# Patient Record
Sex: Female | Born: 1940 | Race: White | Hispanic: No | Marital: Married | Smoking: Never smoker
Health system: Southern US, Community
[De-identification: ages and names within clinical notes are randomized; demographics above are authoritative.]

## PROBLEM LIST (undated history)

## (undated) DIAGNOSIS — C801 Malignant (primary) neoplasm, unspecified: Secondary | ICD-10-CM

## (undated) DIAGNOSIS — I10 Essential (primary) hypertension: Secondary | ICD-10-CM

## (undated) HISTORY — PX: ABDOMINAL HYSTERECTOMY: SHX81

## (undated) HISTORY — PX: ABDOMINAL SURGERY: SHX537

## (undated) HISTORY — PX: MASTECTOMY: SHX3

---

## 2003-09-25 ENCOUNTER — Encounter: Admission: RE | Admit: 2003-09-25 | Discharge: 2003-09-25 | Payer: Self-pay | Admitting: Oncology

## 2003-10-06 ENCOUNTER — Ambulatory Visit (HOSPITAL_COMMUNITY): Admission: RE | Admit: 2003-10-06 | Discharge: 2003-10-06 | Payer: Self-pay | Admitting: Oncology

## 2004-04-13 ENCOUNTER — Ambulatory Visit (HOSPITAL_COMMUNITY): Admission: RE | Admit: 2004-04-13 | Discharge: 2004-04-13 | Payer: Self-pay | Admitting: Oncology

## 2004-09-23 ENCOUNTER — Encounter (INDEPENDENT_AMBULATORY_CARE_PROVIDER_SITE_OTHER): Payer: Self-pay | Admitting: Cardiology

## 2004-09-23 ENCOUNTER — Ambulatory Visit: Admission: RE | Admit: 2004-09-23 | Discharge: 2004-09-23 | Payer: Self-pay | Admitting: Oncology

## 2004-10-08 ENCOUNTER — Ambulatory Visit: Payer: Self-pay | Admitting: Oncology

## 2005-04-07 ENCOUNTER — Ambulatory Visit: Payer: Self-pay | Admitting: Oncology

## 2005-04-08 ENCOUNTER — Ambulatory Visit (HOSPITAL_COMMUNITY): Admission: RE | Admit: 2005-04-08 | Discharge: 2005-04-08 | Payer: Self-pay | Admitting: Oncology

## 2005-05-06 ENCOUNTER — Ambulatory Visit (HOSPITAL_COMMUNITY): Admission: RE | Admit: 2005-05-06 | Discharge: 2005-05-06 | Payer: Self-pay | Admitting: Oncology

## 2005-06-01 ENCOUNTER — Ambulatory Visit (HOSPITAL_COMMUNITY): Admission: RE | Admit: 2005-06-01 | Discharge: 2005-06-01 | Payer: Self-pay | Admitting: Oncology

## 2005-10-06 ENCOUNTER — Ambulatory Visit: Payer: Self-pay | Admitting: Oncology

## 2006-03-27 ENCOUNTER — Ambulatory Visit: Payer: Self-pay | Admitting: Oncology

## 2006-03-31 LAB — CBC WITH DIFFERENTIAL/PLATELET
EOS%: 4.1 % (ref 0.0–7.0)
Eosinophils Absolute: 0.2 10*3/uL (ref 0.0–0.5)
LYMPH%: 32.2 % (ref 14.0–48.0)
MONO#: 0.4 10*3/uL (ref 0.1–0.9)
MONO%: 7.6 % (ref 0.0–13.0)
NEUT#: 2.8 10*3/uL (ref 1.5–6.5)

## 2006-03-31 LAB — RESEARCH LABS

## 2006-03-31 LAB — COMPREHENSIVE METABOLIC PANEL
BUN: 18 mg/dL (ref 6–23)
Creatinine, Ser: 0.77 mg/dL (ref 0.40–1.20)
Glucose, Bld: 94 mg/dL (ref 70–99)
Sodium: 137 mEq/L (ref 135–145)
Total Bilirubin: 0.4 mg/dL (ref 0.3–1.2)

## 2006-03-31 LAB — LACTATE DEHYDROGENASE: LDH: 189 U/L (ref 94–250)

## 2006-03-31 LAB — CANCER ANTIGEN 27.29: CA 27.29: 14 U/mL (ref 0–39)

## 2006-04-03 ENCOUNTER — Ambulatory Visit (HOSPITAL_COMMUNITY): Admission: RE | Admit: 2006-04-03 | Discharge: 2006-04-03 | Payer: Self-pay | Admitting: Oncology

## 2006-10-09 ENCOUNTER — Ambulatory Visit: Payer: Self-pay | Admitting: Oncology

## 2006-10-12 LAB — CBC WITH DIFFERENTIAL/PLATELET
BASO%: 0.5 % (ref 0.0–2.0)
EOS%: 4.9 % (ref 0.0–7.0)
Eosinophils Absolute: 0.3 10*3/uL (ref 0.0–0.5)
HCT: 41.6 % (ref 34.8–46.6)
HGB: 14.1 g/dL (ref 11.6–15.9)
LYMPH%: 25.9 % (ref 14.0–48.0)
MCH: 30 pg (ref 26.0–34.0)
MCHC: 34 g/dL (ref 32.0–36.0)
MONO%: 5.8 % (ref 0.0–13.0)
RBC: 4.71 10*6/uL (ref 3.70–5.32)
WBC: 5.5 10*3/uL (ref 3.9–10.0)

## 2006-10-12 LAB — COMPREHENSIVE METABOLIC PANEL
Albumin: 4.5 g/dL (ref 3.5–5.2)
Creatinine, Ser: 0.79 mg/dL (ref 0.40–1.20)
Glucose, Bld: 102 mg/dL — ABNORMAL HIGH (ref 70–99)
Total Protein: 7.2 g/dL (ref 6.0–8.3)

## 2006-10-12 LAB — RESEARCH LABS

## 2006-10-12 LAB — CANCER ANTIGEN 27.29: CA 27.29: 17 U/mL (ref 0–39)

## 2007-11-30 ENCOUNTER — Ambulatory Visit: Payer: Self-pay | Admitting: Oncology

## 2007-12-04 ENCOUNTER — Encounter: Payer: Self-pay | Admitting: Oncology

## 2007-12-04 ENCOUNTER — Ambulatory Visit: Payer: Self-pay

## 2008-05-14 IMAGING — CT CT ABDOMEN W/ CM
1 of 3 series · 14 of 32 positions shown, 19 images · non-contrast
Comparison: 05/06/2005

CLINICAL DATA: Breast cancer. Status post right lumpectomy.
TECHNIQUE: Multidetector CT imaging of the chest, abdomen and pelvis was
performed following the standard protocol during bolus administration of
intravenous contrast.

[Series 2: cap 5.0 b40s st · axial · 0.83mm/px · z∈[-637,-77]mm · 14 of 127 slices shown, 19 images]
[im 8/127  soft-tissue]
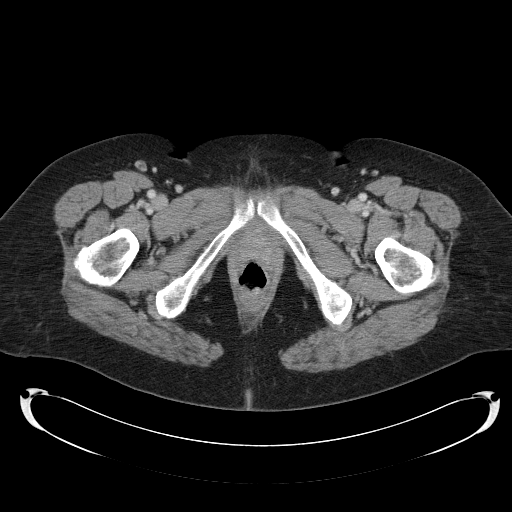
[im 8/127  bone]
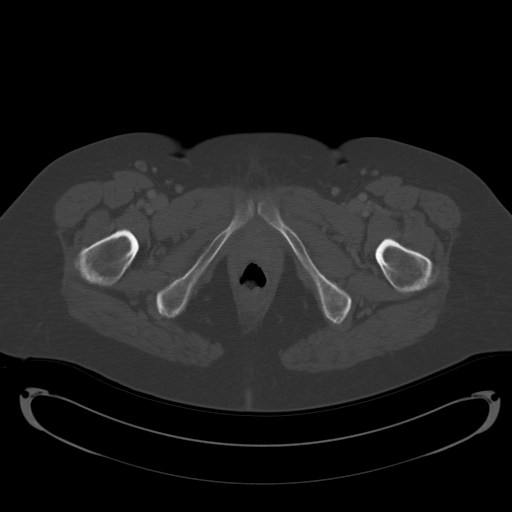
[im 15/127  soft-tissue]
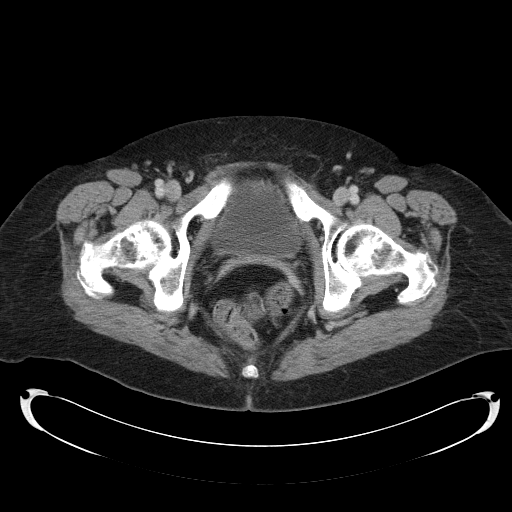
[im 29/127  soft-tissue]
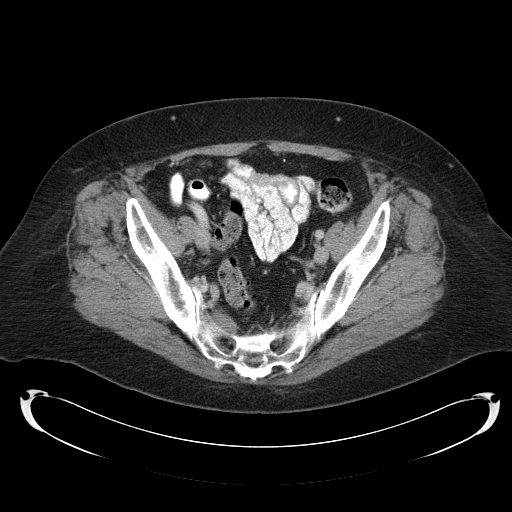
[im 36/127  soft-tissue]
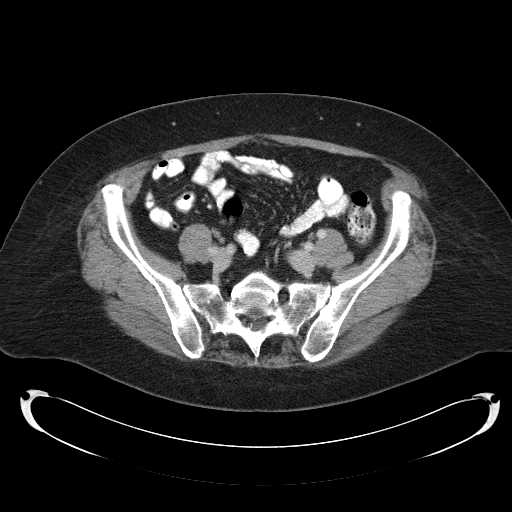
[im 43/127  soft-tissue]
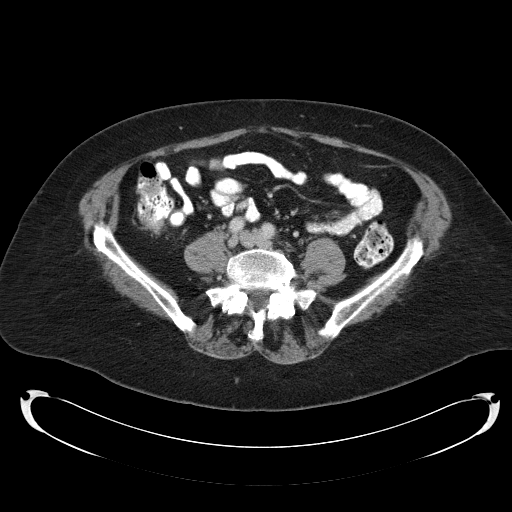
[im 57/127  soft-tissue]
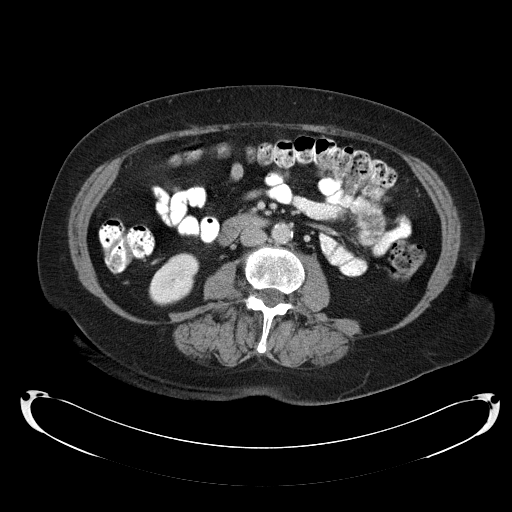
[im 64/127  soft-tissue]
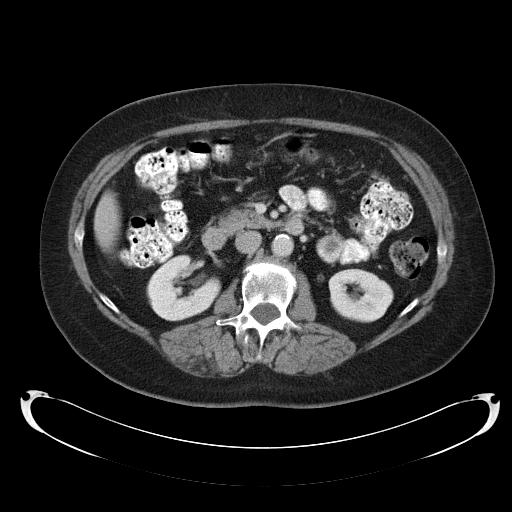
[im 71/127  soft-tissue]
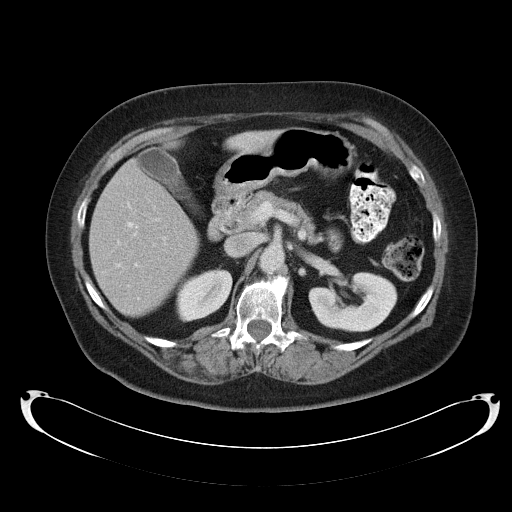
[im 85/127  soft-tissue]
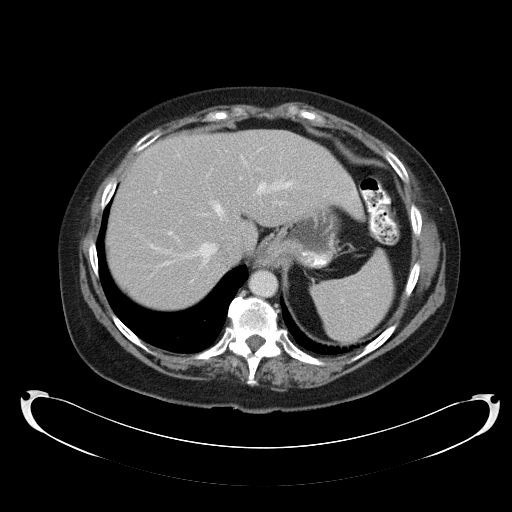
[im 85/127  bone]
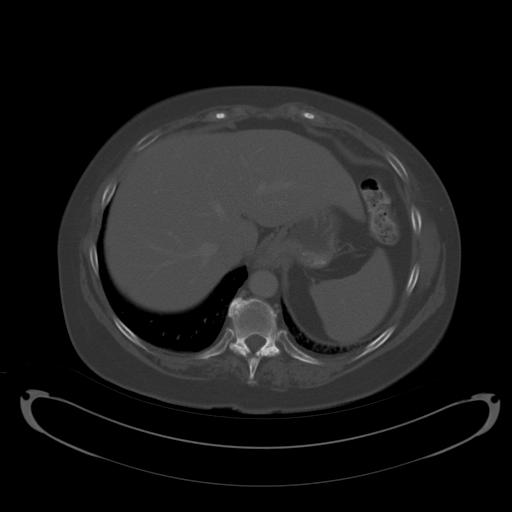
[im 92/127  soft-tissue]
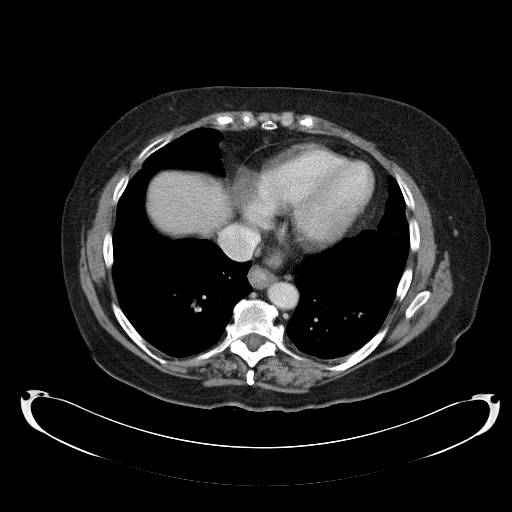
[im 99/127  soft-tissue]
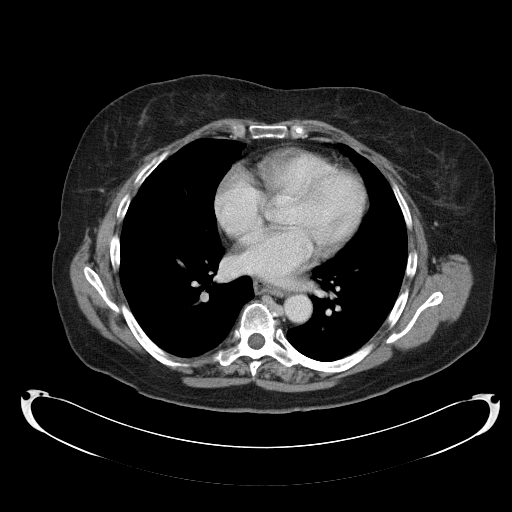
[im 99/127  lung]
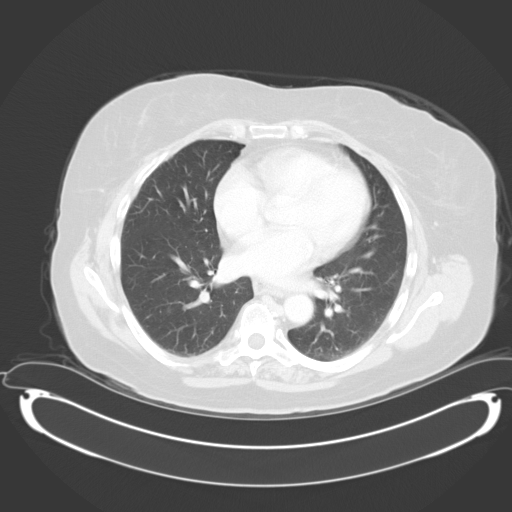
[im 106/127  lung]
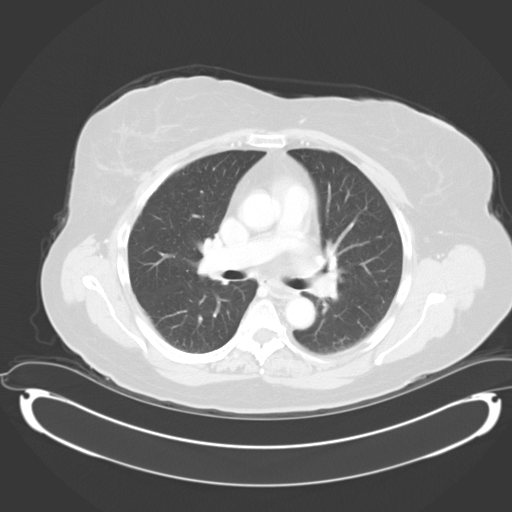
[im 113/127  soft-tissue]
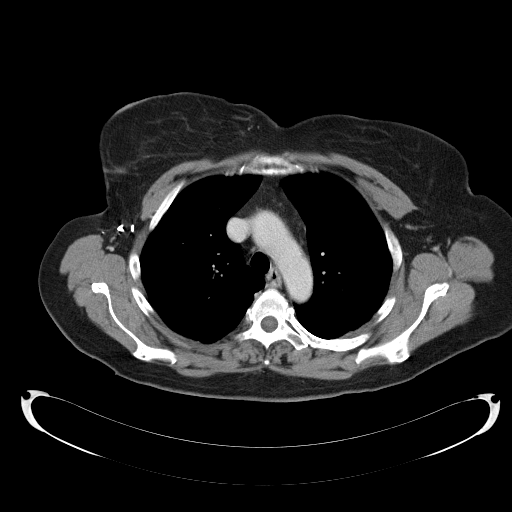
[im 113/127  lung]
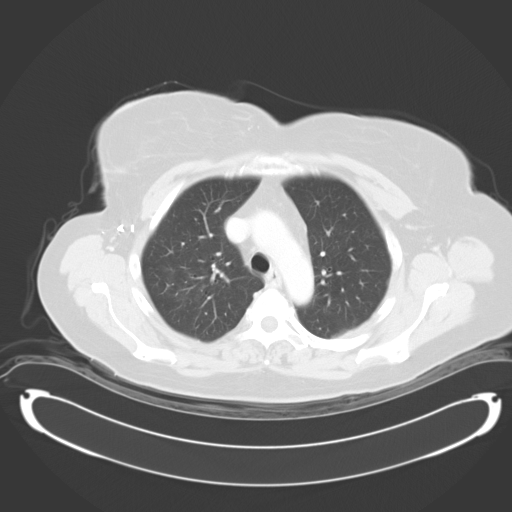
[im 120/127  soft-tissue]
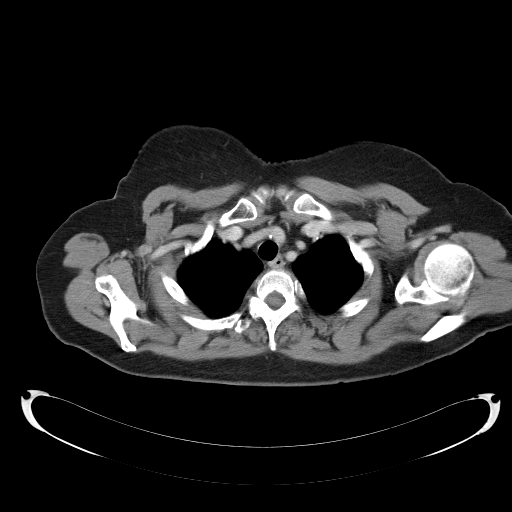
[im 120/127  lung]
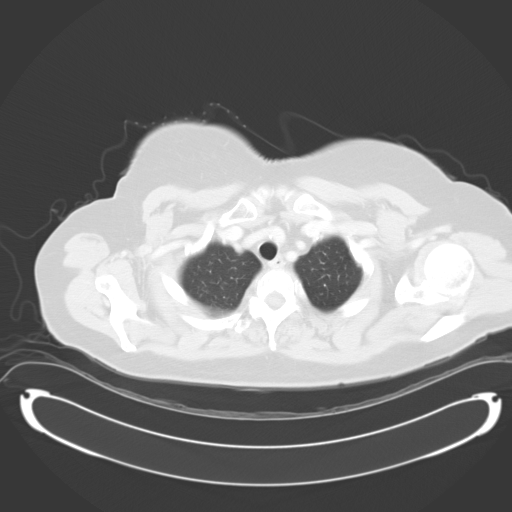

[14 of 32 positions shown; findings below may reference images not displayed]

Contrast:  125 cc Omnipaque 300

CHEST CT WITH CONTRAST:

Surgical clips are noted in the right axilla. No evidence for axillary,
mediastinal, or hilar lymphadenopathy. Small prevascular lymph nodes are
unchanged. The tiny left thyroid nodule is stable. A small hiatal hernia is
again noted and as seen on the previous exam, the esophagus is fluid filled. The
tiny paraesophageal lymph node is stable. 

Lung windows reveal no interval change in the tiny 4 mm right lower lobe nodular
density. The 7 mm nodule in the deep left costophrenic recess is also stable.
IMPRESSION: Stable exam. No evidence for metastatic disease.

ABDOMEN CT WITH CONTRAST:

The liver, spleen, duodenum, pancreas, gallbladder, right adrenal gland, and
right kidney have normal imaging features. 12 mm left renal cyst is stable,
likely with layering milk of calcium. No interval change in the left adrenal
lesion measuring 1.7 cm on today's exam.

A one cm right hepatic lesion was seen on the previous study which is much less
apparent on today's exam and is really only identifiable because it was more
apparent on the previous study.
IMPRESSION: Stable exam. No evidence for new or progressive disease. The left adrenal nodule
is stable and the right liver lesion is much less conspicuous on today's study.

PELVIS CT WITH CONTRAST:

No lymphadenopathy or free fluid. Diverticular changes seen in the sigmoid colon
without evidence for diverticulitis. Terminal ileum and appendix are
unremarkable. A left inguinal hernia contains only fat. 

Status post hysterectomy. No adnexal mass.

Bone windows show degenerative changes in the thoracolumbar spine with evidence
of scoliosis.
IMPRESSION: Stable exam. No evidence for metastatic disease.

## 2010-10-09 ENCOUNTER — Encounter: Payer: Self-pay | Admitting: Oncology

## 2010-10-10 ENCOUNTER — Encounter: Payer: Self-pay | Admitting: Oncology

## 2011-08-31 ENCOUNTER — Telehealth: Payer: Self-pay | Admitting: *Deleted

## 2011-08-31 NOTE — Telephone Encounter (Signed)
Returned voice mail message left by patient today. Discussed final annual [id number] study follow-up visit to occu rat 10 years of follow-up from the end of chemotherapy. Per correspondence with sponsor, final visit may be performed by patient's primary care physician. Patient states that she had been given an appointment with Dr. Avis Epley' nurse practitioner, Griselda Miner, for 2012, but this appointment was cancelled and patient would be seen by PCP instead.  Patient confirms an appointment with Dr. Hamilton Capri at [facility] next Monday. She also reports that she is scheduled to have her annual mammogram performed on January 22nd. Patient is aware that her participation in the study will end after these assessments are complete, and that any new information regarding the clinical trial would be shared with her. 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 Thanked patient for her participation in the study.

## 2011-08-31 NOTE — Telephone Encounter (Signed)
Left message on patient's cell phone voice mail to contact Judithann Graves at [phone] regarding final follow-up visit for research study.

## 2022-12-16 ENCOUNTER — Encounter: Payer: Self-pay | Admitting: Emergency Medicine

## 2022-12-16 ENCOUNTER — Ambulatory Visit (INDEPENDENT_AMBULATORY_CARE_PROVIDER_SITE_OTHER): Payer: Medicare HMO

## 2022-12-16 ENCOUNTER — Ambulatory Visit
Admission: EM | Admit: 2022-12-16 | Discharge: 2022-12-16 | Disposition: A | Discharge status: Home / Self Care | Payer: Medicare HMO | Attending: Physician Assistant | Admitting: Physician Assistant

## 2022-12-16 DIAGNOSIS — J069 Acute upper respiratory infection, unspecified: Secondary | ICD-10-CM

## 2022-12-16 DIAGNOSIS — J029 Acute pharyngitis, unspecified: Secondary | ICD-10-CM | POA: Diagnosis not present

## 2022-12-16 DIAGNOSIS — Z1152 Encounter for screening for COVID-19: Secondary | ICD-10-CM | POA: Insufficient documentation

## 2022-12-16 DIAGNOSIS — R0602 Shortness of breath: Secondary | ICD-10-CM | POA: Diagnosis not present

## 2022-12-16 DIAGNOSIS — R0981 Nasal congestion: Secondary | ICD-10-CM | POA: Insufficient documentation

## 2022-12-16 DIAGNOSIS — R051 Acute cough: Secondary | ICD-10-CM | POA: Insufficient documentation

## 2022-12-16 HISTORY — DX: Essential (primary) hypertension: I10

## 2022-12-16 HISTORY — DX: Malignant (primary) neoplasm, unspecified: C80.1

## 2022-12-16 LAB — GROUP A STREP BY PCR: Group A Strep by PCR: NOT DETECTED

## 2022-12-16 LAB — SARS CORONAVIRUS 2 BY RT PCR: SARS Coronavirus 2 by RT PCR: NEGATIVE

## 2022-12-16 MED ORDER — PROMETHAZINE-DM 6.25-15 MG/5ML PO SYRP: 5.0000 mL | ORAL_SOLUTION | Freq: Four times a day (QID) | ORAL | 0 refills | Status: AC | PRN

## 2022-12-16 MED ORDER — IPRATROPIUM BROMIDE 0.06 % NA SOLN: 2.0000 | Freq: Four times a day (QID) | NASAL | 0 refills | Status: AC

## 2022-12-16 NOTE — Discharge Instructions (Signed)
URI/COLD SYMPTOMS: Your exam today is consistent with a viral illness. Antibiotics are not indicated at this time. Use medications as directed, including cough syrup, nasal saline, and decongestants. Your symptoms should improve over the next few days and resolve within 7-10 days. Increase rest and fluids. F/u if symptoms worsen or predominate such as sore throat, ear pain, productive cough, shortness of breath, or if you develop high fevers or worsening fatigue over the next several days.    

## 2022-12-16 NOTE — ED Provider Notes (Signed)
[facility]    CSN: [id number] Arrival date & time: 12/16/22  0932      History   Chief Complaint Chief Complaint  Patient presents with   Cough   Nasal Congestion    HPI Evelyn Webb is a 82 y.o. female presenting for 5-day history of cough, congestion, runny nose, sneezing and headaches as well as sore throat.  Sore throat pain is 7 out of 10.  Its gotten a little worse.  Denies known fever but has felt feverish.  No reported body aches, sinus pain, chest pain, abdominal pain, vomiting or diarrhea.  Has had a little bit of shortness of breath here and there.  No sick contacts or known exposure to COVID, flu or strep.  Patient reports her family is concerned that she is feeling worse and wanted her to be seen today.  HPI  Past Medical History:  Diagnosis Date   Cancer ([place])    Hypertension     There are no problems to display for this patient.   Past Surgical History:  Procedure Laterality Date   ABDOMINAL HYSTERECTOMY     ABDOMINAL SURGERY     MASTECTOMY      OB History   No obstetric history on file.      Home Medications    Prior to Admission medications   Medication Sig Start Date End Date Taking? Authorizing Provider  atorvastatin (LIPITOR) 80 MG tablet Take 1 tablet by mouth daily. 07/04/22  Yes [provider]  Cobalamin Combinations (VITAMIN B12-FOLIC ACID) XX123456 MCG TABS Frequency:QD   Dosage:0.0     Instructions:  Note:Dose: AS DIRECTED 12/14/12  Yes [provider]  fluticasone (FLONASE) 50 MCG/ACT nasal spray Place 2 sprays into both nostrils daily. 02/24/22  Yes [provider]  gabapentin (NEURONTIN) 100 MG capsule Take 1 in am and 3 in pm 07/04/22  Yes [provider]  ipratropium (ATROVENT) 0.06 % nasal spray Place 2 sprays into both nostrils 4 (four) times daily. 12/16/22  Yes Laurene Footman B, PA-C  latanoprost (XALATAN) 0.005 % ophthalmic solution INSTILL 1 DROP TO BOTH EYES NIGHTLY. 04/28/22  Yes  [provider]  losartan (COZAAR) 100 MG tablet Take 1 tablet by mouth daily. 07/04/22  Yes [provider]  Multiple Vitamin (DAILY VITAMINS) tablet Take by mouth. 11/19/10  Yes [provider]  Omega-3 Fatty Acids (FISH OIL) 1200 MG CAPS Take 1 capsule by mouth every morning. 12/13/21  Yes [provider]  omeprazole (PRILOSEC) 20 MG capsule Take by mouth. 07/04/22  Yes [provider]  promethazine-dextromethorphan (PROMETHAZINE-DM) 6.25-15 MG/5ML syrup Take 5 mLs by mouth 4 (four) times daily as needed. 12/16/22  Yes Laurene Footman B, PA-C  venlafaxine XR (EFFEXOR-XR) 37.5 MG 24 hr capsule Take by mouth. 07/04/22  Yes [provider]  hydrochlorothiazide (HYDRODIURIL) 12.5 MG tablet Take 12.5 mg by mouth daily.    [provider]    Family History History reviewed. No pertinent family history.  Social History Social History   Tobacco Use   Smoking status: Never   Smokeless tobacco: Never  Vaping Use   Vaping Use: Never used  Substance Use Topics   Alcohol use: Yes   Drug use: Never     Allergies   Patient has no known allergies.   Review of Systems Review of Systems  Constitutional:  Positive for fatigue. Negative for chills, diaphoresis and fever.  HENT:  Positive for congestion, rhinorrhea and sore throat. Negative for ear pain,  sinus pressure and sinus pain.   Respiratory:  Positive for cough and shortness of breath. Negative for wheezing.   Cardiovascular:  Negative for chest pain.  Gastrointestinal:  Negative for abdominal pain, nausea and vomiting.  Musculoskeletal:  Negative for arthralgias and myalgias.  Skin:  Negative for rash.  Neurological:  Positive for headaches. Negative for weakness.  Hematological:  Negative for adenopathy.     Physical Exam Triage Vital Signs ED Triage Vitals  Enc Vitals Group     BP      Pulse      Resp      Temp      Temp src      SpO2      Weight      Height       Head Circumference      Peak Flow      Pain Score      Pain Loc      Pain Edu?      Excl. in [place]?    No data found.  Updated Vital Signs BP (!) 136/95 (BP Location: Left Arm)   Pulse 96   Temp 98.1 F (36.7 C) (Oral)   Resp 14   Ht 5\' 7"  (1.702 m)   Wt 165 lb (74.8 kg)   SpO2 96%   BMI 25.84 kg/m      Physical Exam Vitals and nursing note reviewed.  Constitutional:      General: She is not in acute distress.    Appearance: Normal appearance. She is ill-appearing. She is not toxic-appearing.  HENT:     Head: Normocephalic and atraumatic.     Nose: Congestion present.     Mouth/Throat:     Mouth: Mucous membranes are moist.     Pharynx: Oropharynx is clear. Posterior oropharyngeal erythema present.  Eyes:     General: No scleral icterus.       Right eye: No discharge.        Left eye: No discharge.     Conjunctiva/sclera: Conjunctivae normal.  Cardiovascular:     Rate and Rhythm: Normal rate and regular rhythm.     Heart sounds: Normal heart sounds.  Pulmonary:     Effort: Pulmonary effort is normal. No respiratory distress.     Breath sounds: Normal breath sounds.  Musculoskeletal:     Cervical back: Neck supple.  Skin:    General: Skin is dry.  Neurological:     General: No focal deficit present.     Mental Status: She is alert. Mental status is at baseline.     Motor: No weakness.     Gait: Gait normal.  Psychiatric:        Mood and Affect: Mood normal.        Behavior: Behavior normal.        Thought Content: Thought content normal.      UC Treatments / Results  Labs (all labs ordered are listed, but only abnormal results are displayed) Labs Reviewed  SARS CORONAVIRUS 2 BY RT PCR  GROUP A STREP BY PCR    EKG   Radiology DG Chest 2 View  Result Date: 12/16/2022 CLINICAL DATA:  Cough, congestion and fevers. EXAM: CHEST - 2 VIEW COMPARISON:  04/08/2005. FINDINGS: Surgical clips identified within the right axilla. Moderate size hiatal hernia  is identified. Heart size appears normal. No pleural fluid or interstitial edema. No airspace disease. Spondylosis noted within the thoracic spine and upper lumbar spine. IMPRESSION: 1. No acute cardiopulmonary  abnormalities. 2. Moderate size hiatal hernia. Electronically Signed   By: Kerby Moors M.D.   On: 12/16/2022 11:14    Procedures Procedures (including critical care time)  Medications Ordered in UC Medications - No data to display  Initial Impression / Assessment and Plan / UC Course  I have reviewed the triage vital signs and the nursing notes.  Pertinent labs & imaging results that were available during my care of the patient were reviewed by me and considered in my medical decision making (see chart for details).   82 year old female presents for 5-day history of cough, congestion, sneezing, sore throat, shortness of breath.  She is afebrile.  Mildly ill-appearing.  Nontoxic.  On exam is nasal congestion and mild posterior pharyngeal erythema.  Chest clear to auscultation.  COVID test obtained.  Negative.  Ordered strep testing and chest x-ray.  Strep and negative chest x-ray.  Discussed all results with patient.  Advised her she has a viral illness.  Supportive care encouraged with increasing rest and fluids.  Sent Promethazine DM and Atrovent nasal for to pharmacy.  Advised that she can expect to be sick for a week or 2 but she should return for any acute worsening of her symptoms or if she is not feeling better after a couple weeks.   Final Clinical Impressions(s) / UC Diagnoses   Final diagnoses:  Viral upper respiratory tract infection  Acute cough  Nasal congestion  Shortness of breath  Sore throat     Discharge Instructions      URI/COLD SYMPTOMS: Your exam today is consistent with a viral illness. Antibiotics are not indicated at this time. Use medications as directed, including cough syrup, nasal saline, and decongestants. Your symptoms should improve over  the next few days and resolve within 7-10 days. Increase rest and fluids. F/u if symptoms worsen or predominate such as sore throat, ear pain, productive cough, shortness of breath, or if you develop high fevers or worsening fatigue over the next several days.       ED Prescriptions     Medication Sig Dispense Auth. Provider   promethazine-dextromethorphan (PROMETHAZINE-DM) 6.25-15 MG/5ML syrup Take 5 mLs by mouth 4 (four) times daily as needed. 118 mL Laurene Footman B, PA-C   ipratropium (ATROVENT) 0.06 % nasal spray Place 2 sprays into both nostrils 4 (four) times daily. 15 mL Danton Clap, PA-C      PDMP not reviewed this encounter.   Danton Clap, PA-C 12/16/22 1134

## 2022-12-16 NOTE — ED Triage Notes (Addendum)
Patient c/o cough, congestion, runny nose, and sneezing and headache that started yesterday.  Patient unsure of fevers.
